# Patient Record
Sex: Female | Born: 1990 | Race: White | Hispanic: No | Marital: Married | Smoking: Current every day smoker
Health system: Southern US, Community
[De-identification: ages and names within clinical notes are randomized; demographics above are authoritative.]

## PROBLEM LIST (undated history)

## (undated) DIAGNOSIS — F988 Other specified behavioral and emotional disorders with onset usually occurring in childhood and adolescence: Secondary | ICD-10-CM

## (undated) DIAGNOSIS — F32A Depression, unspecified: Secondary | ICD-10-CM

## (undated) DIAGNOSIS — J45909 Unspecified asthma, uncomplicated: Secondary | ICD-10-CM

## (undated) DIAGNOSIS — F4310 Post-traumatic stress disorder, unspecified: Secondary | ICD-10-CM

## (undated) DIAGNOSIS — F419 Anxiety disorder, unspecified: Secondary | ICD-10-CM

## (undated) DIAGNOSIS — F329 Major depressive disorder, single episode, unspecified: Secondary | ICD-10-CM

## (undated) DIAGNOSIS — F603 Borderline personality disorder: Secondary | ICD-10-CM

---

## 2014-08-24 ENCOUNTER — Emergency Department (HOSPITAL_COMMUNITY): Payer: Medicaid Other

## 2014-08-24 ENCOUNTER — Observation Stay (HOSPITAL_COMMUNITY)
Admission: EM | Admit: 2014-08-24 | Discharge: 2014-08-25 | Payer: Medicaid Other | Attending: Internal Medicine | Admitting: Internal Medicine

## 2014-08-24 ENCOUNTER — Encounter (HOSPITAL_COMMUNITY): Payer: Self-pay | Admitting: *Deleted

## 2014-08-24 DIAGNOSIS — J45909 Unspecified asthma, uncomplicated: Secondary | ICD-10-CM | POA: Diagnosis present

## 2014-08-24 DIAGNOSIS — I4581 Long QT syndrome: Secondary | ICD-10-CM

## 2014-08-24 DIAGNOSIS — S0081XA Abrasion of other part of head, initial encounter: Secondary | ICD-10-CM | POA: Insufficient documentation

## 2014-08-24 DIAGNOSIS — F988 Other specified behavioral and emotional disorders with onset usually occurring in childhood and adolescence: Secondary | ICD-10-CM | POA: Diagnosis not present

## 2014-08-24 DIAGNOSIS — F1210 Cannabis abuse, uncomplicated: Secondary | ICD-10-CM | POA: Insufficient documentation

## 2014-08-24 DIAGNOSIS — I959 Hypotension, unspecified: Secondary | ICD-10-CM | POA: Insufficient documentation

## 2014-08-24 DIAGNOSIS — F4310 Post-traumatic stress disorder, unspecified: Secondary | ICD-10-CM

## 2014-08-24 DIAGNOSIS — S00511A Abrasion of lip, initial encounter: Secondary | ICD-10-CM | POA: Insufficient documentation

## 2014-08-24 DIAGNOSIS — R55 Syncope and collapse: Principal | ICD-10-CM

## 2014-08-24 DIAGNOSIS — W1830XA Fall on same level, unspecified, initial encounter: Secondary | ICD-10-CM | POA: Diagnosis not present

## 2014-08-24 DIAGNOSIS — Y929 Unspecified place or not applicable: Secondary | ICD-10-CM | POA: Diagnosis not present

## 2014-08-24 DIAGNOSIS — R9431 Abnormal electrocardiogram [ECG] [EKG]: Secondary | ICD-10-CM | POA: Diagnosis present

## 2014-08-24 DIAGNOSIS — Z79899 Other long term (current) drug therapy: Secondary | ICD-10-CM | POA: Insufficient documentation

## 2014-08-24 DIAGNOSIS — R001 Bradycardia, unspecified: Secondary | ICD-10-CM | POA: Diagnosis not present

## 2014-08-24 DIAGNOSIS — F17210 Nicotine dependence, cigarettes, uncomplicated: Secondary | ICD-10-CM | POA: Diagnosis not present

## 2014-08-24 DIAGNOSIS — F603 Borderline personality disorder: Secondary | ICD-10-CM

## 2014-08-24 DIAGNOSIS — S0101XA Laceration without foreign body of scalp, initial encounter: Secondary | ICD-10-CM | POA: Diagnosis not present

## 2014-08-24 DIAGNOSIS — T148 Other injury of unspecified body region: Secondary | ICD-10-CM

## 2014-08-24 DIAGNOSIS — IMO0002 Reserved for concepts with insufficient information to code with codable children: Secondary | ICD-10-CM

## 2014-08-24 HISTORY — DX: Major depressive disorder, single episode, unspecified: F32.9

## 2014-08-24 HISTORY — DX: Depression, unspecified: F32.A

## 2014-08-24 HISTORY — DX: Borderline personality disorder: F60.3

## 2014-08-24 HISTORY — DX: Unspecified asthma, uncomplicated: J45.909

## 2014-08-24 HISTORY — DX: Other specified behavioral and emotional disorders with onset usually occurring in childhood and adolescence: F98.8

## 2014-08-24 HISTORY — DX: Post-traumatic stress disorder, unspecified: F43.10

## 2014-08-24 HISTORY — DX: Anxiety disorder, unspecified: F41.9

## 2014-08-24 LAB — URINALYSIS, ROUTINE W REFLEX MICROSCOPIC
Bilirubin Urine: NEGATIVE
Glucose, UA: NEGATIVE mg/dL
Hgb urine dipstick: NEGATIVE
Ketones, ur: NEGATIVE mg/dL
Leukocytes, UA: NEGATIVE
NITRITE: NEGATIVE
Protein, ur: NEGATIVE mg/dL
Specific Gravity, Urine: 1.012 (ref 1.005–1.030)
UROBILINOGEN UA: 0.2 mg/dL (ref 0.0–1.0)
pH: 6.5 (ref 5.0–8.0)

## 2014-08-24 LAB — CBC WITH DIFFERENTIAL/PLATELET
BASOS PCT: 1 % (ref 0–1)
Basophils Absolute: 0.1 10*3/uL (ref 0.0–0.1)
EOS PCT: 6 % — AB (ref 0–5)
Eosinophils Absolute: 0.5 10*3/uL (ref 0.0–0.7)
HCT: 41.6 % (ref 36.0–46.0)
Hemoglobin: 14.2 g/dL (ref 12.0–15.0)
LYMPHS ABS: 1.5 10*3/uL (ref 0.7–4.0)
Lymphocytes Relative: 18 % (ref 12–46)
MCH: 31 pg (ref 26.0–34.0)
MCHC: 34.1 g/dL (ref 30.0–36.0)
MCV: 90.8 fL (ref 78.0–100.0)
Monocytes Absolute: 0.7 10*3/uL (ref 0.1–1.0)
Monocytes Relative: 8 % (ref 3–12)
Neutro Abs: 5.6 10*3/uL (ref 1.7–7.7)
Neutrophils Relative %: 67 % (ref 43–77)
Platelets: 336 10*3/uL (ref 150–400)
RBC: 4.58 MIL/uL (ref 3.87–5.11)
RDW: 12.7 % (ref 11.5–15.5)
WBC: 8.3 10*3/uL (ref 4.0–10.5)

## 2014-08-24 LAB — I-STAT CHEM 8, ED
BUN: 15 mg/dL (ref 6–23)
CALCIUM ION: 1.15 mmol/L (ref 1.12–1.23)
Chloride: 105 mmol/L (ref 96–112)
Creatinine, Ser: 0.7 mg/dL (ref 0.50–1.10)
Glucose, Bld: 63 mg/dL — ABNORMAL LOW (ref 70–99)
HCT: 46 % (ref 36.0–46.0)
HEMOGLOBIN: 15.6 g/dL — AB (ref 12.0–15.0)
Potassium: 4.5 mmol/L (ref 3.5–5.1)
Sodium: 139 mmol/L (ref 135–145)
TCO2: 22 mmol/L (ref 0–100)

## 2014-08-24 LAB — RAPID URINE DRUG SCREEN, HOSP PERFORMED
Amphetamines: POSITIVE — AB
BARBITURATES: NOT DETECTED
BENZODIAZEPINES: NOT DETECTED
COCAINE: NOT DETECTED
Opiates: NOT DETECTED
TETRAHYDROCANNABINOL: POSITIVE — AB

## 2014-08-24 LAB — I-STAT BETA HCG BLOOD, ED (MC, WL, AP ONLY): I-stat hCG, quantitative: <5 m[IU]/mL (ref <5)

## 2014-08-24 LAB — CBG MONITORING, ED: Glucose-Capillary: 77 mg/dL (ref 70–99)

## 2014-08-24 LAB — MAGNESIUM: Magnesium: 2.4 mg/dL (ref 1.5–2.5)

## 2014-08-24 LAB — ETHANOL: Alcohol, Ethyl (B): <5 mg/dL (ref 0–9)

## 2014-08-24 MED ORDER — NICOTINE 14 MG/24HR TD PT24
14.0000 mg | MEDICATED_PATCH | Freq: Every day | TRANSDERMAL | Status: DC
  Administered 2014-08-24 – 2014-08-25 (×2): 14 mg via TRANSDERMAL
  Filled 2014-08-24 (×2): qty 1

## 2014-08-24 MED ORDER — SODIUM CHLORIDE 0.9 % IV SOLN
INTRAVENOUS | Status: DC
Start: 2014-08-24 — End: 2014-08-25
  Administered 2014-08-24 – 2014-08-25 (×3): via INTRAVENOUS

## 2014-08-24 MED ORDER — ZOLPIDEM TARTRATE 5 MG PO TABS: 5.0000 mg | ORAL_TABLET | Freq: Every evening | ORAL | Status: DC | PRN

## 2014-08-24 MED ORDER — SODIUM CHLORIDE 0.9 % IV BOLUS (SEPSIS)
1000.0000 mL | Freq: Once | INTRAVENOUS | Status: AC
  Administered 2014-08-24: 1000 mL via INTRAVENOUS

## 2014-08-24 MED ORDER — IBUPROFEN 200 MG PO TABS
400.0000 mg | ORAL_TABLET | Freq: Four times a day (QID) | ORAL | Status: DC | PRN
  Administered 2014-08-25: 400 mg via ORAL
  Filled 2014-08-24: qty 2

## 2014-08-24 MED ORDER — SODIUM CHLORIDE 0.9 % IJ SOLN
3.0000 mL | Freq: Two times a day (BID) | INTRAMUSCULAR | Status: DC
Start: 2014-08-24 — End: 2014-08-25

## 2014-08-24 MED ORDER — DIPHENHYDRAMINE HCL 50 MG/ML IJ SOLN
25.0000 mg | Freq: Once | INTRAMUSCULAR | Status: AC
  Administered 2014-08-24: 25 mg via INTRAVENOUS
  Filled 2014-08-24: qty 1

## 2014-08-24 MED ORDER — ALBUTEROL SULFATE (2.5 MG/3ML) 0.083% IN NEBU: 2.5000 mg | INHALATION_SOLUTION | RESPIRATORY_TRACT | Status: DC | PRN

## 2014-08-24 MED ORDER — HYDROXYZINE HCL 25 MG PO TABS: 50.0000 mg | ORAL_TABLET | Freq: Four times a day (QID) | ORAL | Status: DC | PRN

## 2014-08-24 MED ORDER — MOMETASONE FURO-FORMOTEROL FUM 100-5 MCG/ACT IN AERO
2.0000 | INHALATION_SPRAY | Freq: Two times a day (BID) | RESPIRATORY_TRACT | Status: DC
  Administered 2014-08-24 – 2014-08-25 (×2): 2 via RESPIRATORY_TRACT
  Filled 2014-08-24: qty 8.8

## 2014-08-24 MED ORDER — PROCHLORPERAZINE EDISYLATE 5 MG/ML IJ SOLN
10.0000 mg | Freq: Once | INTRAMUSCULAR | Status: AC
  Administered 2014-08-24: 10 mg via INTRAVENOUS
  Filled 2014-08-24: qty 2

## 2014-08-24 MED ORDER — ONDANSETRON HCL 4 MG/2ML IJ SOLN: 4.0000 mg | Freq: Four times a day (QID) | INTRAMUSCULAR | Status: DC | PRN

## 2014-08-24 MED ORDER — ALBUTEROL SULFATE HFA 108 (90 BASE) MCG/ACT IN AERS: 2.0000 | INHALATION_SPRAY | RESPIRATORY_TRACT | Status: DC | PRN

## 2014-08-24 MED ORDER — TETANUS-DIPHTH-ACELL PERTUSSIS 5-2.5-18.5 LF-MCG/0.5 IM SUSP
0.5000 mL | Freq: Once | INTRAMUSCULAR | Status: AC
  Administered 2014-08-24: 0.5 mL via INTRAMUSCULAR
  Filled 2014-08-24: qty 0.5

## 2014-08-24 MED ORDER — ENOXAPARIN SODIUM 40 MG/0.4ML ~~LOC~~ SOLN
40.0000 mg | SUBCUTANEOUS | Status: DC
  Administered 2014-08-24: 40 mg via SUBCUTANEOUS
  Filled 2014-08-24 (×2): qty 0.4

## 2014-08-24 MED ORDER — ALPRAZOLAM 0.25 MG PO TABS
0.2500 mg | ORAL_TABLET | Freq: Three times a day (TID) | ORAL | Status: DC | PRN
  Administered 2014-08-25: 0.25 mg via ORAL
  Filled 2014-08-24: qty 1

## 2014-08-24 MED ORDER — ACETAMINOPHEN 650 MG RE SUPP: 650.0000 mg | Freq: Four times a day (QID) | RECTAL | Status: DC | PRN

## 2014-08-24 MED ORDER — ASPIRIN-ACETAMINOPHEN-CAFFEINE 250-250-65 MG PO TABS
1.0000 | ORAL_TABLET | Freq: Three times a day (TID) | ORAL | Status: DC | PRN
  Filled 2014-08-24 (×3): qty 1

## 2014-08-24 MED ORDER — ACETAMINOPHEN 325 MG PO TABS: 650.0000 mg | ORAL_TABLET | Freq: Four times a day (QID) | ORAL | Status: DC | PRN

## 2014-08-24 MED ORDER — ENSURE COMPLETE PO LIQD: 237.0000 mL | Freq: Two times a day (BID) | ORAL | Status: DC

## 2014-08-24 MED ORDER — ONDANSETRON HCL 4 MG PO TABS: 4.0000 mg | ORAL_TABLET | Freq: Four times a day (QID) | ORAL | Status: DC | PRN

## 2014-08-24 MED ORDER — SODIUM CHLORIDE 0.9 % IV BOLUS (SEPSIS)
1000.0000 mL | Freq: Once | INTRAVENOUS | Status: AC
  Administered 2014-08-25: 1000 mL via INTRAVENOUS

## 2014-08-24 NOTE — ED Notes (Signed)
CBG was 64

## 2014-08-24 NOTE — Progress Notes (Signed)
Received report from Mules[place], RN in ED.

## 2014-08-24 NOTE — ED Notes (Signed)
CBG was 77 

## 2014-08-24 NOTE — Progress Notes (Signed)
Pt arrived to unit alert and oriented x4. Oriented to room, unit, and staff.  Bed in lowest position and call bell is within reach. Will continue to monitor. 

## 2014-08-24 NOTE — ED Notes (Signed)
Attempted report X1
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
## 2014-08-24 NOTE — H&P (Signed)
[facility] History and Physical  Patient: Deborah Morrow  MRN: [id number]  DOB: 05-22-1991  DOS: the patient was seen and examined on 08/24/2014 PCP: Pcp Not In System  Chief Complaint: Fall  HPI: Deborah Morrow is a 24 y.o. female with Past medical history of borderline personality disorder and PTSD, asthma, ADD. The patient is presenting with an episode of fall. Patient mentions that throughout the day she has been feeling dizzy and lightheaded and today she came to his before for volume and again and after the game she was found in the parking with bleeding and face down. She was not conscious. The event was not witnessed. When EMS arrived they found she was confused and not coherent. Later on she regained alertness and was unable to remember the event. She denied any assault or abuse. She had another episode of passing out 1 week ago which was informed to her by her sister. Reportedly no loss of bowel or bladder control. No tongue bite. She was recently diagnosed with PTSD and was given one month of psychotropic medication and has not been able to follow-up with psychiatry unit. She uses marijuana when she has anxiety or feeling depressed. She denies any IV drug abuse. There are multiple scratch/track marks on her hand which she states is because of her scratching herself when she is anxious. She occasionally take VYVANSE, when she feels like. She denies any sense of feeling unsafe at home abdominal pain and pelvic pain or discharge. No fever no chills. No chest pain or shortness of breath. No nausea no vomiting or diarrhea. No burning urination. No vaginal discharge. LMP was 08/14/2014. She smokes cigarettes 1 pack a day. she mentions on her mother's side she has extensive family history of heart issues but does not remember anybody having a pacemaker device for heart.she tells me that her mother has rare type of heartbeat.  no family history of sudden cardiac death. She denies  any prior motor vehicle accident or syncopal event before this 2 weeks.  The patient is coming from home. And at her baseline independent for most of her ADL.  Review of Systems: as mentioned in the history of present illness.  A Comprehensive review of the other systems is negative.  Past Medical History  Diagnosis Date  . Borderline personality disorder   . PTSD (post-traumatic stress disorder)   . Asthma   . ADD (attention deficit disorder)    History reviewed. No pertinent past surgical history. Social History:  reports that she has been smoking Cigarettes.  She has been smoking about 1.00 pack per day. She does not have any smokeless tobacco history on file. She reports that she uses illicit drugs (Marijuana). Her alcohol history is not on file.  No Known Allergies  Family History  Problem Relation Age of Onset  . Arrhythmia Mother     Prior to Admission medications   Medication Sig Start Date End Date Taking? Authorizing Provider  albuterol (PROVENTIL HFA;VENTOLIN HFA) 108 (90 BASE) MCG/ACT inhaler Inhale 2 puffs into the lungs every 4 (four) hours as needed for wheezing or shortness of breath. ProAir   Yes Historical Provider, MD  FLUoxetine (PROZAC) 20 MG capsule Take 20 mg by mouth daily.  07/02/14   Historical Provider, MD  Fluticasone-Salmeterol (ADVAIR) 250-50 MCG/DOSE AEPB Inhale 1 puff into the lungs 2 (two) times daily.   Yes Historical Provider, MD  hydrOXYzine (ATARAX/VISTARIL) 50 MG tablet Take 50 mg by mouth 4 (four) times daily as needed for  anxiety.  06/29/14  Yes Historical Provider, MD  ibuprofen (ADVIL,MOTRIN) 200 MG tablet Take 400 mg by mouth every 6 (six) hours as needed (pain).   Yes Historical Provider, MD  Lisdexamfetamine Dimesylate (VYVANSE PO) Take 1 tablet by mouth daily.    Yes Historical Provider, MD  mirtazapine (REMERON) 30 MG tablet Take 30 mg by mouth at bedtime. 06/29/14   Historical Provider, MD  prazosin (MINIPRESS) 1 MG capsule Take 1 mg by  mouth at bedtime. 06/29/14   Historical Provider, MD    Physical Exam: Filed Vitals:   08/24/14 1930 08/24/14 1945 08/24/14 2109 08/24/14 2115  BP: 98/63  Pulse: 81 73 74 68  Temp:      TempSrc:      Resp:    19  SpO2: 94% 93% 97% 97%    General: Alert, Awake and Oriented to Time, Place and Person. Appear in mild distress Eyes: PERRL ENT: Oral Mucosa clear moist. Neck: no JVD Cardiovascular: S1 and S2 Present, no Murmur, Peripheral Pulses Present Respiratory: Bilateral Air entry equal and Decreased, Clear to Auscultation, oCrackles, no wheezes Abdomen: Bowel Sound present, Soft and non tender Skin: no Rash Multiple scratch marks and small bruise in various states of healing on face as well as hands. Extremities: no Pedal edema, no calf tenderness Neurologic: Grossly no focal neuro deficit.  Labs on Admission:  CBC:  Recent Labs Lab 08/24/14 1625 08/24/14 1653  WBC  --  8.3  NEUTROABS  --  5.6  HGB 15.6* 14.2  HCT 46.0 41.6  MCV  --  90.8  PLT  --  336    CMP     Component Value Date/Time   NA 139 08/24/2014 1625   K 4.5 08/24/2014 1625   CL 105 08/24/2014 1625   GLUCOSE 63* 08/24/2014 1625   BUN 15 08/24/2014 1625   CREATININE 0.70 08/24/2014 1625    No results for input(s): LIPASE, AMYLASE in the last 168 hours.  No results for input(s): CKTOTAL, CKMB, CKMBINDEX, TROPONINI in the last 168 hours. BNP (last 3 results) No results for input(s): PROBNP in the last 8760 hours.  Radiological Exams on Admission: Ct Head Wo Contrast  08/24/2014   CLINICAL DATA:  [place] EMS [place] [facility] [place] [employer] [place]. When EMS arrived she appeared to be in Post seizure like activity. She was last seen normal 25 mins before she was found. Pt stated that she blacked out last week and gashed the right side of her head. She stated that she also blacked out today and doesn't remember much after that. She said she was recently diagnosed with  a borderline personality order.  EXAM: CT HEAD WITHOUT CONTRAST  CT CERVICAL SPINE WITHOUT CONTRAST  TECHNIQUE: Multidetector CT imaging of the head and cervical spine was performed following the standard protocol without intravenous contrast. Multiplanar CT image reconstructions of the cervical spine were also generated.  COMPARISON:  None.  FINDINGS: CT HEAD FINDINGS  There is no evidence of mass effect, midline shift or extra-axial fluid collections. There is no evidence of a space-occupying lesion or intracranial hemorrhage. There is no evidence of a cortical-based area of acute infarction.  The ventricles and sulci are appropriate for the patient's age. The basal cisterns are patent.  Visualized portions of the orbits are unremarkable. There is near complete opacification of the right maxillary sinus. There is mild left ethmoid sinus mucosal thickening. The mastoid sinuses are clear.  The osseous  structures are unremarkable.  CT CERVICAL SPINE FINDINGS  The alignment is anatomic. The vertebral body heights are maintained. There is no acute fracture. There is no static listhesis. The prevertebral soft tissues are normal. The intraspinal soft tissues are not fully imaged on this examination due to poor soft tissue contrast, but there is no gross soft tissue abnormality.  The disc spaces are maintained.  The visualized portions of the lung apices demonstrate no focal abnormality.  IMPRESSION: 1. No acute intracranial pathology. 2. No acute osseous injury of the cervical spine.   Electronically Signed   By: Elige Ko   On: 08/24/2014 18:26   Ct Cervical Spine Wo Contrast  08/24/2014   CLINICAL DATA:  [place] EMS [place] [facility] [place] [employer] [place]. When EMS arrived she appeared to be in Post seizure like activity. She was last seen normal 25 mins before she was found. Pt stated that she blacked out last week and gashed the right side of her head. She stated that she also blacked out today  and doesn't remember much after that. She said she was recently diagnosed with a borderline personality order.  EXAM: CT HEAD WITHOUT CONTRAST  CT CERVICAL SPINE WITHOUT CONTRAST  TECHNIQUE: Multidetector CT imaging of the head and cervical spine was performed following the standard protocol without intravenous contrast. Multiplanar CT image reconstructions of the cervical spine were also generated.  COMPARISON:  None.  FINDINGS: CT HEAD FINDINGS  There is no evidence of mass effect, midline shift or extra-axial fluid collections. There is no evidence of a space-occupying lesion or intracranial hemorrhage. There is no evidence of a cortical-based area of acute infarction.  The ventricles and sulci are appropriate for the patient's age. The basal cisterns are patent.  Visualized portions of the orbits are unremarkable. There is near complete opacification of the right maxillary sinus. There is mild left ethmoid sinus mucosal thickening. The mastoid sinuses are clear.  The osseous structures are unremarkable.  CT CERVICAL SPINE FINDINGS  The alignment is anatomic. The vertebral body heights are maintained. There is no acute fracture. There is no static listhesis. The prevertebral soft tissues are normal. The intraspinal soft tissues are not fully imaged on this examination due to poor soft tissue contrast, but there is no gross soft tissue abnormality.  The disc spaces are maintained.  The visualized portions of the lung apices demonstrate no focal abnormality.  IMPRESSION: 1. No acute intracranial pathology. 2. No acute osseous injury of the cervical spine.   Electronically Signed   By: Elige Ko   On: 08/24/2014 18:26   EKG: Independently reviewed. prolonged QT interval.  Assessment/Plan Principal Problem:   Syncope and collapse Active Problems:   Syncope   Borderline personality disorder   PTSD (post-traumatic stress disorder)   Asthma   Laceration   QT prolongation   1. Syncope and collapse   The patient has been presenting with an episode of syncope. She denies any prodromal symptoms prior to this. She had 2 episodes in the last 2 weeks. One episode that she brought her to the hospital today was associated with scalp laceration. Workup so far shows positive UDS for amphetamine and cannabinoids. Patient mentions she has been using marijuana and used it yesterday last. She denies any IV drug abuse and denies using crystal meth, she mentions she uses prescription vyvense as needed whenever she has depression. Patient has mildly prolonged QTC and borderline low blood pressure. With the recurrent admissions  for a syncope and collapse the patient will be admitted in hospital for observation. I would monitor her on telemetry all connected provider Cheree Ditto in the morning. Patient will receive IV hydration. Orthostatic blood pressure check in morning.  2. Drug abuse The patient's UDS is positive for marijuana which patient is agreeing that she is using but she is denying any other drug abuse. She claims she is using Vyvanse as needed. At present no reliable way to diferentiate labwise between amphetamine and methamhetamine. Pt was counselled for rehabilitaiton and assistance in managing her abuse and she meniton that he does not want to quit using marijuana completely.  3. Asthma. Continue inhalers.  4. PTSD, borderline personality disorder. Patient is not using any of her psychotropic medication since last few weeks. At present holding them and the patient has follow-up with her psychiatrist  Advance goals of care discussion: full code  DVT Prophylaxis: subcutaneous Heparin Nutrition: regular diet  Disposition: Admitted to observation in telemetry unit.  Author: Lynden Oxford, MD [facility] Pager: [phone] 08/24/2014, 9:30 PM    If 7PM-7AM, please contact night-coverage [email or web address] Password [email or web address]

## 2014-08-25 DIAGNOSIS — S0101XA Laceration without foreign body of scalp, initial encounter: Secondary | ICD-10-CM | POA: Diagnosis not present

## 2014-08-25 DIAGNOSIS — S0081XA Abrasion of other part of head, initial encounter: Secondary | ICD-10-CM | POA: Diagnosis not present

## 2014-08-25 DIAGNOSIS — S00511A Abrasion of lip, initial encounter: Secondary | ICD-10-CM | POA: Diagnosis not present

## 2014-08-25 DIAGNOSIS — R55 Syncope and collapse: Secondary | ICD-10-CM | POA: Diagnosis not present

## 2014-08-25 LAB — COMPREHENSIVE METABOLIC PANEL
ALBUMIN: 2.9 g/dL — AB (ref 3.5–5.2)
ALT: 15 U/L (ref 0–35)
ANION GAP: 5 (ref 5–15)
AST: 17 U/L (ref 0–37)
Alkaline Phosphatase: 80 U/L (ref 39–117)
BILIRUBIN TOTAL: 0.6 mg/dL (ref 0.3–1.2)
BUN: 7 mg/dL (ref 6–23)
CO2: 22 mmol/L (ref 19–32)
Calcium: 8.3 mg/dL — ABNORMAL LOW (ref 8.4–10.5)
Chloride: 111 mmol/L (ref 96–112)
Creatinine, Ser: 0.59 mg/dL (ref 0.50–1.10)
GFR calc Af Amer: >90 mL/min (ref >90)
GFR calc non Af Amer: >90 mL/min (ref >90)
GLUCOSE: 89 mg/dL (ref 70–99)
POTASSIUM: 3.7 mmol/L (ref 3.5–5.1)
Sodium: 138 mmol/L (ref 135–145)
Total Protein: 5.1 g/dL — ABNORMAL LOW (ref 6.0–8.3)

## 2014-08-25 LAB — LACTIC ACID, PLASMA: Lactic Acid, Venous: 0.7 mmol/L (ref 0.5–2.0)

## 2014-08-25 LAB — PROTIME-INR
INR: 1.2 (ref 0.00–1.49)
Prothrombin Time: 15.3 seconds — ABNORMAL HIGH (ref 11.6–15.2)

## 2014-08-25 LAB — CBC
HEMATOCRIT: 34 % — AB (ref 36.0–46.0)
Hemoglobin: 11.5 g/dL — ABNORMAL LOW (ref 12.0–15.0)
MCH: 31 pg (ref 26.0–34.0)
MCHC: 33.8 g/dL (ref 30.0–36.0)
MCV: 91.6 fL (ref 78.0–100.0)
PLATELETS: 294 10*3/uL (ref 150–400)
RBC: 3.71 MIL/uL — AB (ref 3.87–5.11)
RDW: 12.8 % (ref 11.5–15.5)
WBC: 8.3 10*3/uL (ref 4.0–10.5)

## 2014-08-25 LAB — CORTISOL-AM, BLOOD: Cortisol - AM: 4.8 ug/dL (ref 4.3–22.4)

## 2014-08-25 LAB — TSH: TSH: 1.435 u[IU]/mL (ref 0.350–4.500)

## 2014-08-25 MED ORDER — HYDROCODONE-ACETAMINOPHEN 5-325 MG PO TABS
1.0000 | ORAL_TABLET | Freq: Four times a day (QID) | ORAL | Status: DC | PRN
Start: 2014-08-25 — End: 2014-08-25
  Administered 2014-08-25: 1 via ORAL
  Filled 2014-08-25: qty 1

## 2014-08-25 MED ORDER — SODIUM CHLORIDE 0.9 % IV BOLUS (SEPSIS)
500.0000 mL | Freq: Once | INTRAVENOUS | Status: AC
  Administered 2014-08-25: 500 mL via INTRAVENOUS

## 2014-08-25 MED ORDER — ENSURE COMPLETE PO LIQD
237.0000 mL | Freq: Three times a day (TID) | ORAL | Status: DC
  Administered 2014-08-25: 237 mL via ORAL

## 2014-08-25 NOTE — Discharge Summary (Addendum)
Physician Discharge Summary  Deborah Morrow JYN:[id number] DOB: 12/14/1990 DOA: 08/24/2014  PCP: Pcp Not In System  Admit date: 08/24/2014 Discharge date: 08/25/2014  Time spent: 35 minutes  Recommendations for Outpatient Follow-up:  1. Need to order ECHO, continue with IV fluids. EEG. 2. Cortisol and HIV pending.   Patient decide to leave AMA.   Discharge Diagnoses:    Syncope and collapse   Syncope   Borderline personality disorder   PTSD (post-traumatic stress disorder)   Asthma   Laceration   QT prolongation   Discharge Condition: stable.   Diet recommendation: regular  Filed Weights   08/25/14 0440  Weight: 50.259 kg (110 lb 12.8 oz)    History of present illness:  Deborah Morrow is a 24 y.o. female with Past medical history of borderline personality disorder and PTSD, asthma, ADD. The patient is presenting with an episode of fall. Patient mentions that throughout the day she has been feeling dizzy and lightheaded and today she came to his before for volume and again and after the game she was found in the parking with bleeding and face down. She was not conscious. The event was not witnessed. When EMS arrived they found she was confused and not coherent. Later on she regained alertness and was unable to remember the event. She denied any assault or abuse. She had another episode of passing out 1 week ago which was informed to her by her sister. Reportedly no loss of bowel or bladder control. No tongue bite. She was recently diagnosed with PTSD and was given one month of psychotropic medication and has not been able to follow-up with psychiatry unit. She uses marijuana when she has anxiety or feeling depressed. She denies any IV drug abuse. There are multiple scratch/track marks on her hand which she states is because of her scratching herself when she is anxious. She occasionally take VYVANSE, when she feels like. She denies any sense of feeling unsafe at home  abdominal pain and pelvic pain or discharge. No fever no chills. No chest pain or shortness of breath. No nausea no vomiting or diarrhea. No burning urination. No vaginal discharge. LMP was 08/14/2014. She smokes cigarettes 1 pack a day. she mentions on her mother's side she has extensive family history of heart issues but does not remember anybody having a pacemaker device for heart.she tells me that her mother has rare type of heartbeat. no family history of sudden cardiac death. She denies any prior motor vehicle accident or syncopal event before this 2 weeks.  The patient is coming from home. And at her baseline independent for most of her ADL.  Hospital Course:  1-Syncope; suspect secondary to hypotension, decrease volume.  -ECHO ordered, check EEG.  -continue with IV fluids.  -also in setting of amphetamine.  Patient request transfer to another facility to be near home. I spoke with Dr Jannette SpannerLadani from hospital in [place]. He will take patient in transfer. Patient to be transported by ambulance.  Patient decline to be transfer by ambulance, she decide to leave AMA.   2-Hypotension; continue with IV fluids. Lactic acid 0.7.  3-prolong Qt; mg normal. Repeat EKG today. Mg at 2.0  4-weight loss; check TSH 1.435, HIV, Cortisol level. Start ensure. Nutritionist consulted.   5-Sinus bradycardia; asymptomatic.     Consultations:  none  Discharge Exam: Filed Vitals:   08/25/14 1500  BP: 105/71  Pulse: 93  Temp: 98.1 F (36.7 C)  Resp: 17    General: Alert in no distress.  Cardiovascular: S 1, S 2 RRR Respiratory: CTA  Discharge Instructions    Current Discharge Medication List    CONTINUE these medications which have NOT CHANGED   Details  albuterol (PROVENTIL HFA;VENTOLIN HFA) 108 (90 BASE) MCG/ACT inhaler Inhale 2 puffs into the lungs every 4 (four) hours as needed for wheezing or shortness of breath. ProAir    FLUoxetine (PROZAC) 20 MG capsule Take 20 mg by mouth  daily.  Refills: 1    Fluticasone-Salmeterol (ADVAIR) 250-50 MCG/DOSE AEPB Inhale 1 puff into the lungs 2 (two) times daily.    hydrOXYzine (ATARAX/VISTARIL) 50 MG tablet Take 50 mg by mouth 4 (four) times daily as needed for anxiety.  Refills: 0    ibuprofen (ADVIL,MOTRIN) 200 MG tablet Take 400 mg by mouth every 6 (six) hours as needed (pain).    Lisdexamfetamine Dimesylate (VYVANSE PO) Take 1 tablet by mouth daily.     mirtazapine (REMERON) 30 MG tablet Take 30 mg by mouth at bedtime. Refills: 0    prazosin (MINIPRESS) 1 MG capsule Take 1 mg by mouth at bedtime. Refills: 0       No Known Allergies    The results of significant diagnostics from this hospitalization (including imaging, microbiology, ancillary and laboratory) are listed below for reference.    Significant Diagnostic Studies: Ct Head Wo Contrast  08/24/2014   CLINICAL DATA:  [facility] EMS [place] [facility] [place]. When EMS arrived she appeared to be in Post seizure like activity. She was last seen normal 25 mins before she was found. Pt stated that she blacked out last week and gashed the right side of her head. She stated that she also blacked out today and doesn't remember much after that. She said she was recently diagnosed with a borderline personality order.  EXAM: CT HEAD WITHOUT CONTRAST  CT CERVICAL SPINE WITHOUT CONTRAST  TECHNIQUE: Multidetector CT imaging of the head and cervical spine was performed following the standard protocol without intravenous contrast. Multiplanar CT image reconstructions of the cervical spine were also generated.  COMPARISON:  None.  FINDINGS: CT HEAD FINDINGS  There is no evidence of mass effect, midline shift or extra-axial fluid collections. There is no evidence of a space-occupying lesion or intracranial hemorrhage. There is no evidence of a cortical-based area of acute infarction.  The ventricles and sulci are appropriate for the patient's age. The  basal cisterns are patent.  Visualized portions of the orbits are unremarkable. There is near complete opacification of the right maxillary sinus. There is mild left ethmoid sinus mucosal thickening. The mastoid sinuses are clear.  The osseous structures are unremarkable.  CT CERVICAL SPINE FINDINGS  The alignment is anatomic. The vertebral body heights are maintained. There is no acute fracture. There is no static listhesis. The prevertebral soft tissues are normal. The intraspinal soft tissues are not fully imaged on this examination due to poor soft tissue contrast, but there is no gross soft tissue abnormality.  The disc spaces are maintained.  The visualized portions of the lung apices demonstrate no focal abnormality.  IMPRESSION: 1. No acute intracranial pathology. 2. No acute osseous injury of the cervical spine.   Electronically Signed   By: Elige Ko   On: 08/24/2014 18:26   Ct Cervical Spine Wo Contrast  08/24/2014   CLINICAL DATA:  [facility] EMS [place] [facility] [place] [facility] [place]. When EMS arrived she appeared to be in Post seizure like activity.  She was last seen normal 25 mins before she was found. Pt stated that she blacked out last week and gashed the right side of her head. She stated that she also blacked out today and doesn't remember much after that. She said she was recently diagnosed with a borderline personality order.  EXAM: CT HEAD WITHOUT CONTRAST  CT CERVICAL SPINE WITHOUT CONTRAST  TECHNIQUE: Multidetector CT imaging of the head and cervical spine was performed following the standard protocol without intravenous contrast. Multiplanar CT image reconstructions of the cervical spine were also generated.  COMPARISON:  None.  FINDINGS: CT HEAD FINDINGS  There is no evidence of mass effect, midline shift or extra-axial fluid collections. There is no evidence of a space-occupying lesion or intracranial hemorrhage. There is no evidence of a cortical-based area of acute  infarction.  The ventricles and sulci are appropriate for the patient's age. The basal cisterns are patent.  Visualized portions of the orbits are unremarkable. There is near complete opacification of the right maxillary sinus. There is mild left ethmoid sinus mucosal thickening. The mastoid sinuses are clear.  The osseous structures are unremarkable.  CT CERVICAL SPINE FINDINGS  The alignment is anatomic. The vertebral body heights are maintained. There is no acute fracture. There is no static listhesis. The prevertebral soft tissues are normal. The intraspinal soft tissues are not fully imaged on this examination due to poor soft tissue contrast, but there is no gross soft tissue abnormality.  The disc spaces are maintained.  The visualized portions of the lung apices demonstrate no focal abnormality.  IMPRESSION: 1. No acute intracranial pathology. 2. No acute osseous injury of the cervical spine.   Electronically Signed   By: Elige Ko   On: 08/24/2014 18:26    Microbiology: No results found for this or any previous visit (from the past 240 hour(s)).   Labs: Basic Metabolic Panel:  Recent Labs Lab 08/24/14 1625 08/24/14 1653 08/25/14 0440  NA 139  --  138  K 4.5  --  3.7  CL 105  --  111  CO2  --   --  22  GLUCOSE 63*  --  89  BUN 15  --  7  CREATININE 0.70  --  0.59  CALCIUM  --   --  8.3*  MG  --  2.4  --    Liver Function Tests:  Recent Labs Lab 08/25/14 0440  AST 17  ALT 15  ALKPHOS 80  BILITOT 0.6  PROT 5.1*  ALBUMIN 2.9*   No results for input(s): LIPASE, AMYLASE in the last 168 hours. No results for input(s): AMMONIA in the last 168 hours. CBC:  Recent Labs Lab 08/24/14 1625 08/24/14 1653 08/25/14 0440  WBC  --  8.3 8.3  NEUTROABS  --  5.6  --   HGB 15.6* 14.2 11.5*  HCT 46.0 41.6 34.0*  MCV  --  90.8 91.6  PLT  --  336 294   Cardiac Enzymes: No results for input(s): CKTOTAL, CKMB, CKMBINDEX, TROPONINI in the last 168 hours. BNP: BNP (last 3  results) No results for input(s): PROBNP in the last 8760 hours. CBG:  Recent Labs Lab 08/24/14 1817  GLUCAP 77       Signed:  Doryce Mcgregory A  [facility] 08/25/2014, 4:13 PM

## 2014-08-25 NOTE — Progress Notes (Signed)
Patient expressing desire to be discharged due to "pressing family matters" involving children at home.  She states she wants to "be transferred to a hospital closer to where (I) live."  Blood pressure shows improvement after 500 ml. bolus of N/S.  Husband pressuring her to be discharged because he has "appointments" he can't miss tomorrow.  He also states he is her "Power of attorney".  No documentation to support this, but will continue to support patient and reiterate importance of treatment.  Medicated for headache with moderate effects.

## 2014-08-25 NOTE — Progress Notes (Signed)
[facility] PROGRESS NOTE  Deborah Morrow XTG:[id number] DOB: 1990-11-23 DOA: 08/24/2014 PCP: Pcp Not In System  Assessment/Plan: 1-Syncope; suspect secondary to hypotension, decrease volume.  -ECHO ordered, check EEG.  -continue with IV fluids.  -also in setting of amphetamine.   2-Hypotension; continue with IV fluids. Lactic acid 0.7.  3-prolong Qt; mg normal. Repeat EKG today.   4-weight loss; check TSH 1.435, HIV, Cortisol level. Start ensure. Nutritionist consulted.  5-sinus bradycardia; asymptomatic.     Code Status: Full Code.  Family Communication: care discussed with patient Disposition Plan: remain inpatient.    Consultants:  none  Procedures:  ECHO;     Antibiotics:  none  HPI/Subjective: Still feel weak and shaky.  She has had 20 pound of weight loss last 2 month.  She doesn't eat a lot, she only has one meal per day. She is very busy with her 26 year old son.   Objective: Filed Vitals:   08/25/14 0642  BP: 88/54  Pulse:   Temp:   Resp:     Intake/Output Summary (Last 24 hours) at 08/25/14 0806 Last data filed at 08/25/14 0659  Gross per 24 hour  Intake      0 ml  Output    700 ml  Net   -700 ml   Filed Weights   08/25/14 0440  Weight: 50.259 kg (110 lb 12.8 oz)    Exam:   General:  Alert in no distress  Cardiovascular: S 1, S 2 RRR  Respiratory: CTA  Abdomen: BS present, soft, nt  Musculoskeletal:no edema.   Data Reviewed: Basic Metabolic Panel:  Recent Labs Lab 08/24/14 1625 08/24/14 1653 08/25/14 0440  NA 139  --  138  K 4.5  --  3.7  CL 105  --  111  CO2  --   --  22  GLUCOSE 63*  --  89  BUN 15  --  7  CREATININE 0.70  --  0.59  CALCIUM  --   --  8.3*  MG  --  2.4  --    Liver Function Tests:  Recent Labs Lab 08/25/14 0440  AST 17  ALT 15  ALKPHOS 80  BILITOT 0.6  PROT 5.1*  ALBUMIN 2.9*   No results for input(s): LIPASE, AMYLASE in the last 168 hours. No results for input(s): AMMONIA  in the last 168 hours. CBC:  Recent Labs Lab 08/24/14 1625 08/24/14 1653 08/25/14 0440  WBC  --  8.3 8.3  NEUTROABS  --  5.6  --   HGB 15.6* 14.2 11.5*  HCT 46.0 41.6 34.0*  MCV  --  90.8 91.6  PLT  --  336 294   Cardiac Enzymes: No results for input(s): CKTOTAL, CKMB, CKMBINDEX, TROPONINI in the last 168 hours. BNP (last 3 results) No results for input(s): PROBNP in the last 8760 hours. CBG:  Recent Labs Lab 08/24/14 1817  GLUCAP 77    No results found for this or any previous visit (from the past 240 hour(s)).   Studies: Ct Head Wo Contrast  08/24/2014   CLINICAL DATA:  [facility] EMS [facility]. When EMS arrived she appeared to be in Post seizure like activity. She was last seen normal 25 mins before she was found. Pt stated that she blacked out last week and gashed the right side of her head. She stated that she also blacked out today and doesn't remember much after that. She said she was  recently diagnosed with a borderline personality order.  EXAM: CT HEAD WITHOUT CONTRAST  CT CERVICAL SPINE WITHOUT CONTRAST  TECHNIQUE: Multidetector CT imaging of the head and cervical spine was performed following the standard protocol without intravenous contrast. Multiplanar CT image reconstructions of the cervical spine were also generated.  COMPARISON:  None.  FINDINGS: CT HEAD FINDINGS  There is no evidence of mass effect, midline shift or extra-axial fluid collections. There is no evidence of a space-occupying lesion or intracranial hemorrhage. There is no evidence of a cortical-based area of acute infarction.  The ventricles and sulci are appropriate for the patient's age. The basal cisterns are patent.  Visualized portions of the orbits are unremarkable. There is near complete opacification of the right maxillary sinus. There is mild left ethmoid sinus mucosal thickening. The mastoid sinuses are clear.  The osseous structures are unremarkable.   CT CERVICAL SPINE FINDINGS  The alignment is anatomic. The vertebral body heights are maintained. There is no acute fracture. There is no static listhesis. The prevertebral soft tissues are normal. The intraspinal soft tissues are not fully imaged on this examination due to poor soft tissue contrast, but there is no gross soft tissue abnormality.  The disc spaces are maintained.  The visualized portions of the lung apices demonstrate no focal abnormality.  IMPRESSION: 1. No acute intracranial pathology. 2. No acute osseous injury of the cervical spine.   Electronically Signed   By: Elige Ko   On: 08/24/2014 18:26   Ct Cervical Spine Wo Contrast  08/24/2014   CLINICAL DATA:  [facility] When EMS arrived she appeared to be in Post seizure like activity. She was last seen normal 25 mins before she was found. Pt stated that she blacked out last week and gashed the right side of her head. She stated that she also blacked out today and doesn't remember much after that. She said she was recently diagnosed with a borderline personality order.  EXAM: CT HEAD WITHOUT CONTRAST  CT CERVICAL SPINE WITHOUT CONTRAST  TECHNIQUE: Multidetector CT imaging of the head and cervical spine was performed following the standard protocol without intravenous contrast. Multiplanar CT image reconstructions of the cervical spine were also generated.  COMPARISON:  None.  FINDINGS: CT HEAD FINDINGS  There is no evidence of mass effect, midline shift or extra-axial fluid collections. There is no evidence of a space-occupying lesion or intracranial hemorrhage. There is no evidence of a cortical-based area of acute infarction.  The ventricles and sulci are appropriate for the patient's age. The basal cisterns are patent.  Visualized portions of the orbits are unremarkable. There is near complete opacification of the right maxillary sinus. There is mild left ethmoid sinus mucosal  thickening. The mastoid sinuses are clear.  The osseous structures are unremarkable.  CT CERVICAL SPINE FINDINGS  The alignment is anatomic. The vertebral body heights are maintained. There is no acute fracture. There is no static listhesis. The prevertebral soft tissues are normal. The intraspinal soft tissues are not fully imaged on this examination due to poor soft tissue contrast, but there is no gross soft tissue abnormality.  The disc spaces are maintained.  The visualized portions of the lung apices demonstrate no focal abnormality.  IMPRESSION: 1. No acute intracranial pathology. 2. No acute osseous injury of the cervical spine.   Electronically Signed   By: Elige Ko   On: 08/24/2014 18:26    Scheduled Meds: .  enoxaparin (LOVENOX) injection  40 mg Subcutaneous Q24H  . feeding supplement (ENSURE COMPLETE)  237 mL Oral BID BM  . mometasone-formoterol  2 puff Inhalation BID  . nicotine  14 mg Transdermal Daily  . sodium chloride  3 mL Intravenous Q12H   Continuous Infusions: . sodium chloride 125 mL/hr at 08/25/14 4540    Principal Problem:   Syncope and collapse Active Problems:   Syncope   Borderline personality disorder   PTSD (post-traumatic stress disorder)   Asthma   Laceration   QT prolongation    Time spent: 35 minutes.     Hartley Barefoot A  [facility] Pager [phone]. If 7PM-7AM, please contact night-coverage at [email or web address], password [facility] 08/25/2014, 8:06 AM  LOS: 1 day

## 2014-08-25 NOTE — Progress Notes (Signed)
Patient signed self out AMA.  MD aware.  Patient and husband state that they intend to go to [facility] ER for evaluation and further treatment but did not want to go by ambulance, which was required.  Patient ambulated to exit with husband escorted by husband.  Picked up medications in pharmacy on way to private vehicle.

## 2014-08-25 NOTE — Progress Notes (Signed)
Pt BP 88/54. Claiborne Billingsallahan, NP paged. Ordered 500 cc bolus. Will continue to monitor pt.

## 2014-08-25 NOTE — Progress Notes (Signed)
Patient and husband expressing desire to go home or to be transferred to an alternative hospital closer to their home.  Patient cites her PTSD as a reason for her to have to be closer to her family for her recuperation.  Wanting to have mother-in-law transport in a private vehicle in lieu of an ambulance, which could not be justified.  Dr. Sunnie Nielsenegalado to speak with patient and husband for clarification and care goals.

## 2014-08-25 NOTE — Progress Notes (Signed)
UR completed 

## 2014-08-26 LAB — GLUCOSE, CAPILLARY: Glucose-Capillary: 63 mg/dL — ABNORMAL LOW (ref 70–99)

## 2014-08-27 LAB — HIV ANTIBODY (ROUTINE TESTING W REFLEX): HIV Screen 4th Generation wRfx: NONREACTIVE

## 2016-06-25 IMAGING — CT CT HEAD W/O CM
3 of 7 series · 11 of 47 positions shown, 13 images · non-contrast
Comparison: None.

CLINICAL DATA: [facility] EMS [facility] [place]. When EMS arrived she appeared to be in Post
seizure like activity. She was last seen normal 25 mins before she
was found. Pt stated that she blacked out last week and gashed the
right side of her head. She stated that she also blacked out today
and doesn't remember much after that. She said she was recently
diagnosed with a borderline personality order.

EXAM:
CT HEAD WITHOUT CONTRAST
CT CERVICAL SPINE WITHOUT CONTRAST
TECHNIQUE: Multidetector CT imaging of the head and cervical spine was
performed following the standard protocol without intravenous
contrast. Multiplanar CT image reconstructions of the cervical spine
were also generated.

[Series 307: axial recons · axial · 0.29mm/px · z∈[+91,+177]mm · 5 of 78 slices shown, 7 images]
[im 13/78  brain]
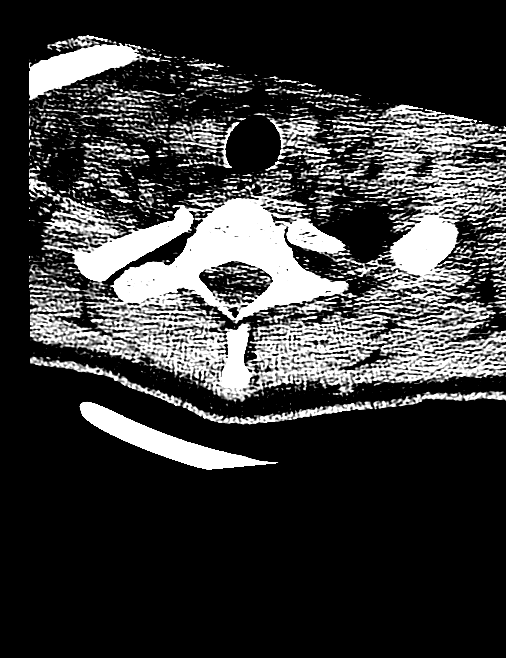
[im 13/78  bone]
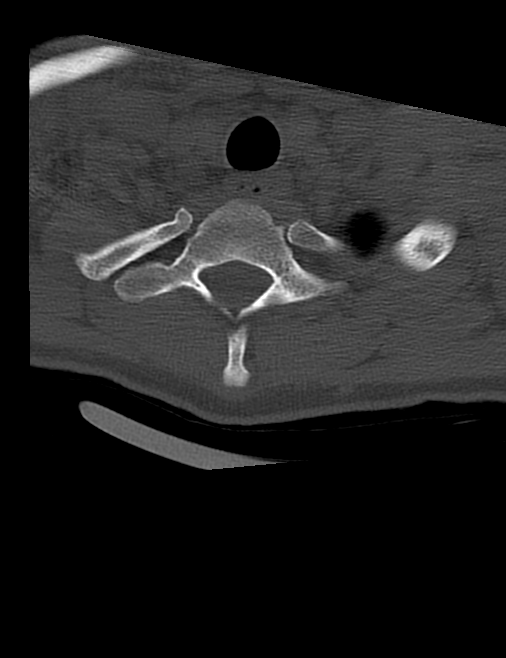
[im 26/78  brain]
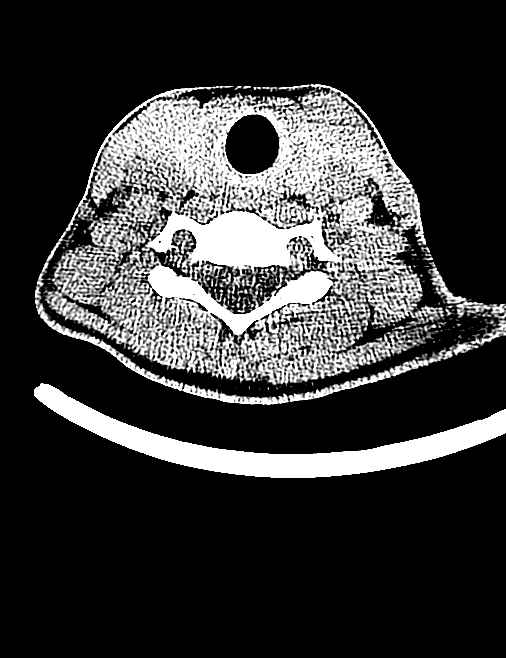
[im 39/78  brain]
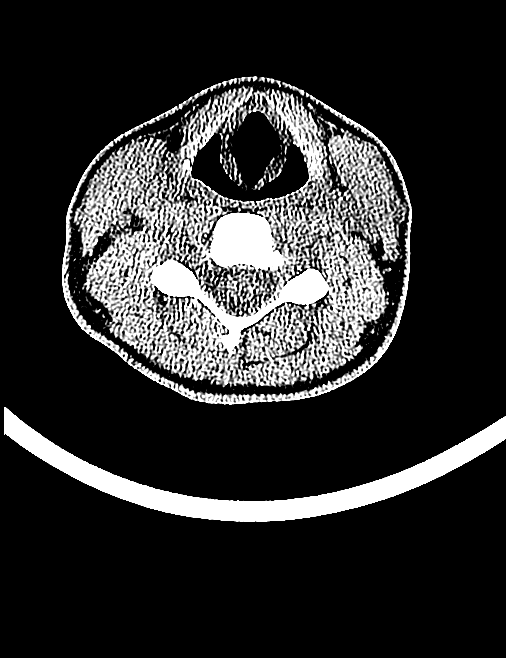
[im 52/78  brain]
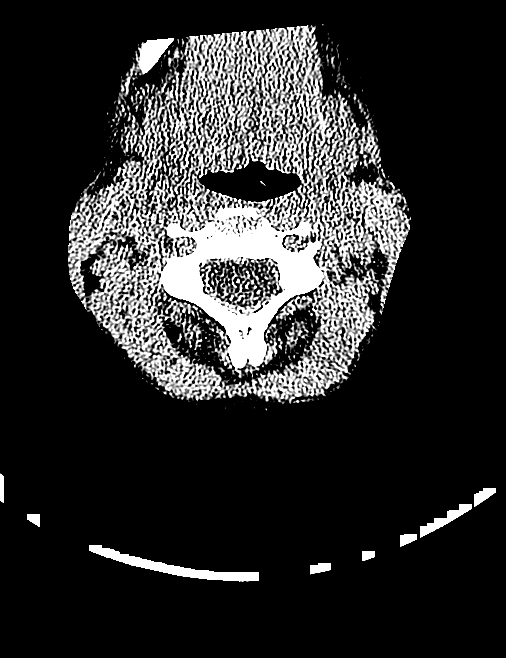
[im 65/78  brain]
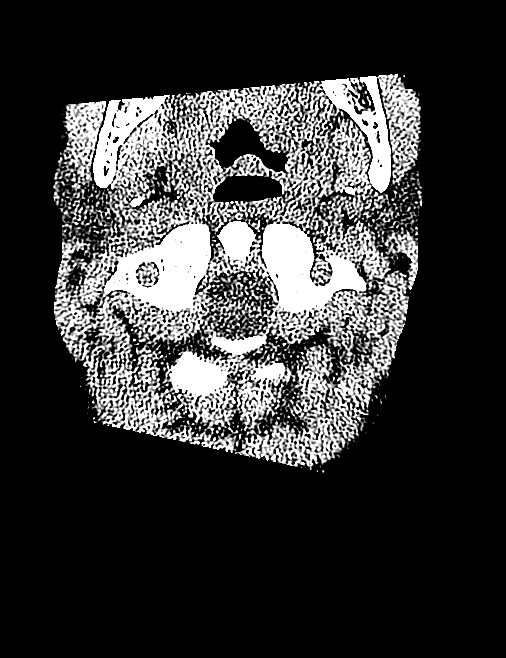
[im 65/78  bone]
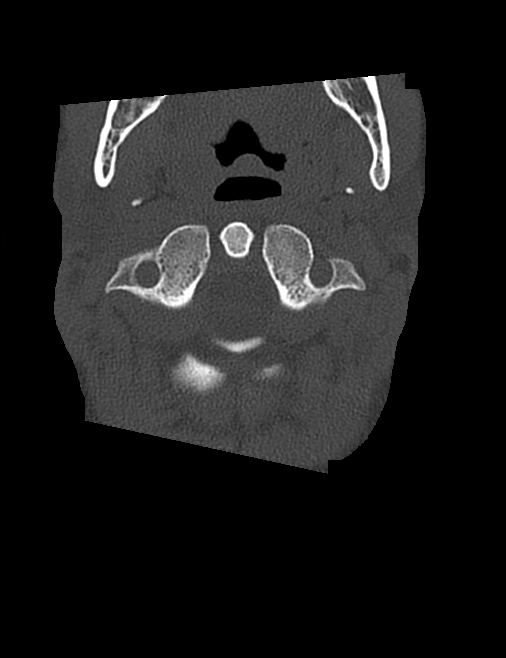

[Series 308: coronals · coronal · 0.29mm/px · 3 of 30 slices shown]
[im 10/30  brain]
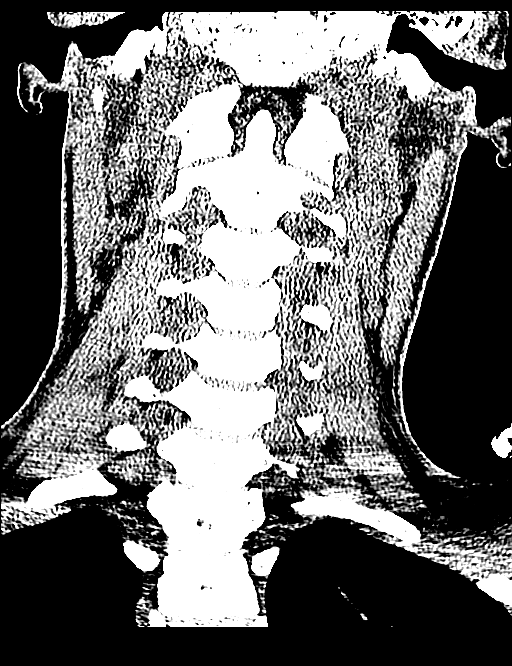
[im 13/30  brain]
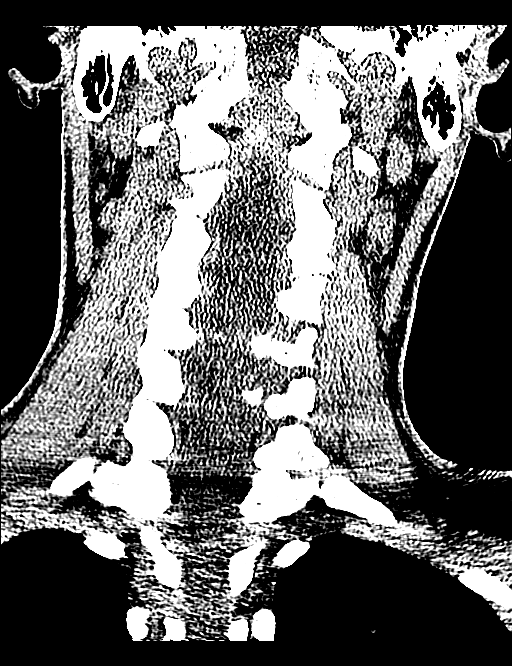
[im 17/30  brain]
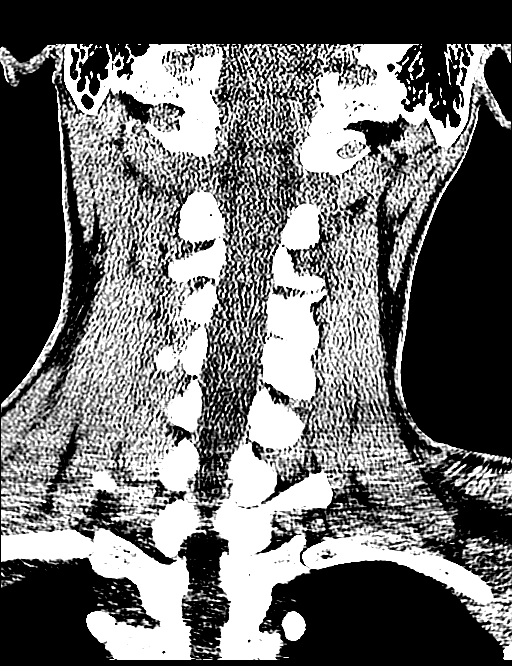

[Series 309: sagittals · sagittal · 0.30mm/px · 3 of 43 slices shown]
[im 15/43  brain]
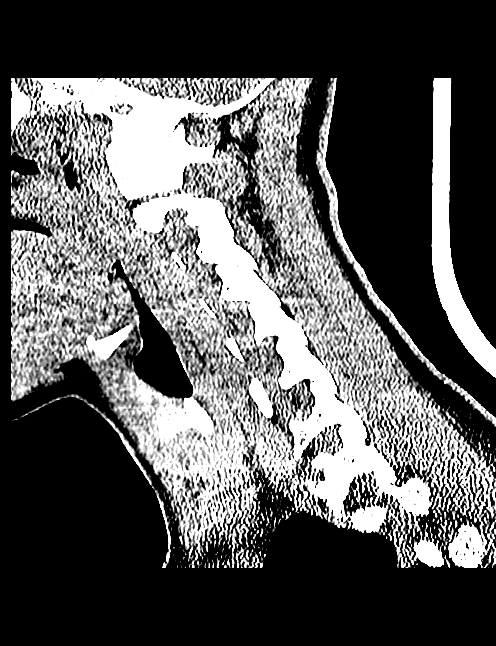
[im 22/43  brain]
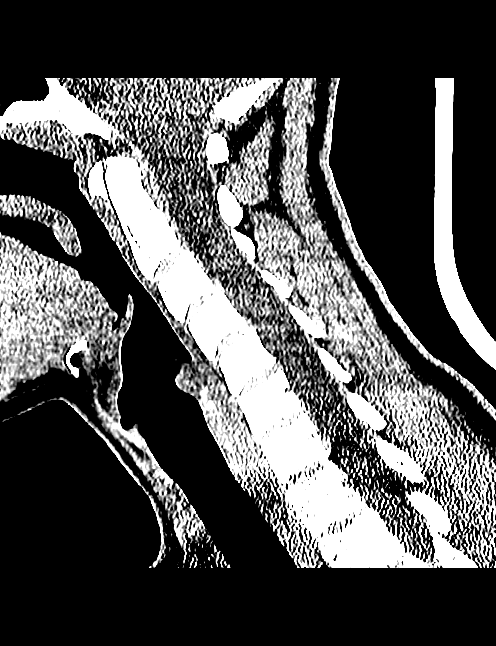
[im 29/43  brain]
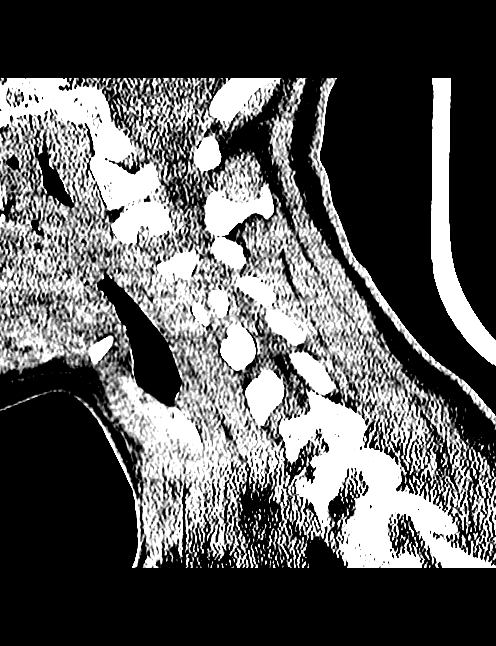

[11 of 47 positions shown; findings below may reference images not displayed]

FINDINGS: CT HEAD FINDINGS

There is no evidence of mass effect, midline shift or extra-axial
fluid collections. There is no evidence of a space-occupying lesion
or intracranial hemorrhage. There is no evidence of a cortical-based
area of acute infarction.

The ventricles and sulci are appropriate for the patient's age. The
basal cisterns are patent.

Visualized portions of the orbits are unremarkable. There is near
complete opacification of the right maxillary sinus. There is mild
left ethmoid sinus mucosal thickening. The mastoid sinuses are
clear.

The osseous structures are unremarkable.

CT CERVICAL SPINE FINDINGS

The alignment is anatomic. The vertebral body heights are
maintained. There is no acute fracture. There is no static
listhesis. The prevertebral soft tissues are normal. The intraspinal
soft tissues are not fully imaged on this examination due to poor
soft tissue contrast, but there is no gross soft tissue abnormality.

The disc spaces are maintained.

The visualized portions of the lung apices demonstrate no focal
abnormality.
IMPRESSION: 1. No acute intracranial pathology.
2. No acute osseous injury of the cervical spine.

## 2024-08-26 DEATH — deceased
